# Patient Record
Sex: Female | Born: 1975 | Race: White | Hispanic: No | Marital: Married
Health system: Southern US, Community
[De-identification: ages and names within clinical notes are randomized; demographics above are authoritative.]

---

## 2019-08-29 ENCOUNTER — Other Ambulatory Visit: Payer: Self-pay

## 2019-08-29 ENCOUNTER — Emergency Department (HOSPITAL_BASED_OUTPATIENT_CLINIC_OR_DEPARTMENT_OTHER)
Admission: EM | Admit: 2019-08-29 | Discharge: 2019-08-29 | Disposition: A | Discharge status: Home / Self Care | Payer: Medicaid Other | Attending: Emergency Medicine | Admitting: Emergency Medicine

## 2019-08-29 ENCOUNTER — Encounter (HOSPITAL_BASED_OUTPATIENT_CLINIC_OR_DEPARTMENT_OTHER): Payer: Self-pay | Admitting: Emergency Medicine

## 2019-08-29 ENCOUNTER — Emergency Department (HOSPITAL_BASED_OUTPATIENT_CLINIC_OR_DEPARTMENT_OTHER): Payer: Medicaid Other

## 2019-08-29 DIAGNOSIS — K1120 Sialoadenitis, unspecified: Secondary | ICD-10-CM | POA: Insufficient documentation

## 2019-08-29 DIAGNOSIS — R600 Localized edema: Secondary | ICD-10-CM | POA: Diagnosis present

## 2019-08-29 LAB — BASIC METABOLIC PANEL
Anion gap: 9 (ref 5–15)
BUN: 9 mg/dL (ref 6–20)
CO2: 24 mmol/L (ref 22–32)
Calcium: 8.8 mg/dL — ABNORMAL LOW (ref 8.9–10.3)
Chloride: 104 mmol/L (ref 98–111)
Creatinine, Ser: 0.51 mg/dL (ref 0.44–1.00)
GFR calc Af Amer: >60 mL/min (ref >60)
GFR calc non Af Amer: >60 mL/min (ref >60)
Glucose, Bld: 127 mg/dL — ABNORMAL HIGH (ref 70–99)
Potassium: 3.8 mmol/L (ref 3.5–5.1)
Sodium: 137 mmol/L (ref 135–145)

## 2019-08-29 LAB — CBC WITH DIFFERENTIAL/PLATELET
Abs Immature Granulocytes: 0.06 10*3/uL (ref 0.00–0.07)
Basophils Absolute: 0 10*3/uL (ref 0.0–0.1)
Basophils Relative: 0 %
Eosinophils Absolute: 0.1 10*3/uL (ref 0.0–0.5)
Eosinophils Relative: 1 %
HCT: 38.6 % (ref 36.0–46.0)
Hemoglobin: 12.8 g/dL (ref 12.0–15.0)
Immature Granulocytes: 1 %
Lymphocytes Relative: 14 %
Lymphs Abs: 1.8 10*3/uL (ref 0.7–4.0)
MCH: 30 pg (ref 26.0–34.0)
MCHC: 33.2 g/dL (ref 30.0–36.0)
MCV: 90.6 fL (ref 80.0–100.0)
Monocytes Absolute: 0.8 10*3/uL (ref 0.1–1.0)
Monocytes Relative: 6 %
Neutro Abs: 9.9 10*3/uL — ABNORMAL HIGH (ref 1.7–7.7)
Neutrophils Relative %: 78 %
Platelets: 221 10*3/uL (ref 150–400)
RBC: 4.26 MIL/uL (ref 3.87–5.11)
RDW: 13.2 % (ref 11.5–15.5)
WBC: 12.7 10*3/uL — ABNORMAL HIGH (ref 4.0–10.5)
nRBC: 0 % (ref 0.0–0.2)

## 2019-08-29 MED ORDER — DEXAMETHASONE SODIUM PHOSPHATE 10 MG/ML IJ SOLN
10.0000 mg | Freq: Once | INTRAMUSCULAR | Status: AC
  Administered 2019-08-29: 13:00:00 10 mg via INTRAVENOUS
  Filled 2019-08-29: qty 1

## 2019-08-29 MED ORDER — IBUPROFEN 600 MG PO TABS: 600.0000 mg | ORAL_TABLET | Freq: Four times a day (QID) | ORAL | 0 refills | Status: AC | PRN

## 2019-08-29 MED ORDER — IOHEXOL 300 MG/ML  SOLN
100.0000 mL | Freq: Once | INTRAMUSCULAR | Status: AC | PRN
  Administered 2019-08-29: 75 mL via INTRAVENOUS

## 2019-08-29 MED ORDER — AMOXICILLIN-POT CLAVULANATE 875-125 MG PO TABS: 1.0000 | ORAL_TABLET | Freq: Two times a day (BID) | ORAL | 0 refills | Status: AC

## 2019-08-29 NOTE — ED Triage Notes (Signed)
Pt reports left side lower jaw swelling x 4 days. Pt denies dental pain or injury, normal gait, no s/s of distress. Left side swelling apparent. Pt came from UC after treatment. 400mg  ibuprofen at 0630 today. Pt reports fever of 100.5 last night

## 2019-08-29 NOTE — ED Provider Notes (Signed)
[facility] EMERGENCY DEPARTMENT Provider Note   CSN: [id number] Arrival date & time: 08/29/19  1121     History Chief Complaint  Patient presents with  . Facial Swelling    Erica Beck is a 44 y.o. female presents for evaluation of acute onset, progressively worsening left-sided facial swelling for 3 days.  Reports that symptoms began Thursday evening.  Denies any dental pain, facial trauma, or known insect bites.  Pain is constant, throbbing.  Worsens with palpation and certain movements. Denies neck stiffness. Denies any associated pain with eating.  No difficulty swallowing or drooling.  Reports fevers up to 100.6 F last night, improved with ibuprofen.  She went to urgent care earlier and was given a prescription for Bactrim which she has not started yet but was told that she may need further evaluation in an emergency department.  Reports she is up-to-date on her immunizations.  The history is provided by the patient.       No past medical history on file.  There are no problems to display for this patient.   OB History   No obstetric history on file.     No family history on file.  Social History   Tobacco Use  . Smoking status: Not on file  Substance Use Topics  . Alcohol use: Never  . Drug use: Never    Home Medications Prior to Admission medications   Medication Sig Start Date End Date Taking? Authorizing Provider  amoxicillin-clavulanate (AUGMENTIN) 875-125 MG tablet Take 1 tablet by mouth every 12 (twelve) hours. 08/29/19   Kiano Terrien A, PA-C  ibuprofen (ADVIL) 600 MG tablet Take 1 tablet (600 mg total) by mouth every 6 (six) hours as needed. 08/29/19   Renita Papa, PA-C    Allergies    Patient has no allergy information on record.  Review of Systems   Review of Systems  Constitutional: Negative for chills and fever.  HENT: Positive for facial swelling. Negative for dental problem, trouble swallowing and voice change.     Musculoskeletal: Negative for neck stiffness.  All other systems reviewed and are negative.   Physical Exam Updated Vital Signs BP 112/61   Pulse (!) 102   Temp 99.1 F (37.3 C) (Oral)   Resp 20   Ht 5' (1.524 m)   Wt 117.9 kg   LMP 08/13/2019 (Approximate)   SpO2 98%   BMI 50.78 kg/m   Physical Exam Vitals and nursing note reviewed.  Constitutional:      General: She is not in acute distress.    Appearance: She is well-developed.  HENT:     Head: Normocephalic and atraumatic.     Mouth/Throat:     Comments: Dentition is stable.  No trismus.  No gingival hypertrophy or erythema.  No tenderness to percussion of the teeth.  Tolerating secretions without difficulty.  Posterior oropharynx with no tonsillar hypertrophy, exudates, or uvular deviation.  No tenderness or palpation of the sublingual or subglossal spaces.  There is some left-sided swelling along the angle of the mandible extending into the anterior neck, some tenderness to palpation.  There is no erythema but the area appears mildly indurated.  Nonmobile Eyes:     General:        Right eye: No discharge.        Left eye: No discharge.     Conjunctiva/sclera: Conjunctivae normal.  Neck:     Vascular: No JVD.     Trachea: No tracheal deviation.  Comments: No midline cervical spine tenderness, normal active range of motion at the neck Cardiovascular:     Rate and Rhythm: Normal rate.  Pulmonary:     Effort: Pulmonary effort is normal.  Abdominal:     General: There is no distension.  Musculoskeletal:     Cervical back: Neck supple. No rigidity.  Skin:    General: Skin is warm and dry.     Findings: No erythema.  Neurological:     Mental Status: She is alert.  Psychiatric:        Behavior: Behavior normal.     ED Results / Procedures / Treatments   Labs (all labs ordered are listed, but only abnormal results are displayed) Labs Reviewed  BASIC METABOLIC PANEL - Abnormal; Notable for the following  components:      Result Value   Glucose, Bld 127 (*)    Calcium 8.8 (*)    All other components within normal limits  CBC WITH DIFFERENTIAL/PLATELET - Abnormal; Notable for the following components:   WBC 12.7 (*)    Neutro Abs 9.9 (*)    All other components within normal limits    EKG None  Radiology CT Soft Tissue Neck W Contrast  Result Date: 08/29/2019 CLINICAL DATA:  Lump or swelling left submandibular space. Pain to touch. Tonsil/adenoid disorder. Facial and neck swelling. EXAM: CT NECK WITH CONTRAST TECHNIQUE: Multidetector CT imaging of the neck was performed using the standard protocol following the bolus administration of intravenous contrast. CONTRAST:  55mL OMNIPAQUE IOHEXOL 300 MG/ML  SOLN COMPARISON:  None. FINDINGS: Pharynx and larynx: Mild prominence of the palatine tonsils is noted bilaterally. Slight uniform prominence of the adenoid tissue is present as well. No discrete mass is present. There is some stranding the parapharyngeal fat on the left. Tongue base and vallecula are normal. Epiglottis is within normal limits. Hypopharynx is clear. Vocal cords are midline and symmetrical. Salivary glands: The left submandibular gland is slightly more dense and larger than the right. No discrete mass lesion is present. Duct is within normal limits. The parotid glands and ducts are normal bilaterally. Thyroid: Normal Lymph nodes: Enlarged left submandibular nodes measure up to 1.3 x 1.1 x 1.2 cm. This node is somewhat rounded. Other similar mildly enlarged rounded left submandibular and level 2 lymph nodes are present. Slightly smaller right level 2 lymph nodes are present. There is thickening of the platysma just lateral to the left submandibular gland. No discrete abscess is present. Diffuse subcutaneous and deep tissue stranding is present. Vascular: No significant vascular lesions are present. Limited intracranial: Within normal limits. Visualized orbits: The globes and orbits are  within normal limits. Mastoids and visualized paranasal sinuses: The paranasal sinuses and mastoid air cells are clear. Skeleton: Vertebral body heights and alignment are normal. There is some straightening of the normal cervical lordosis. No discrete dental or periodontal disease is evident. Upper chest: The lung apices are clear. IMPRESSION: 1. Asymmetric enlargement and surrounding stranding of the left submandibular gland suggesting acute sialadenitis. No abscess present. No obstructing lesion is present. 2. Enlarged left submandibular and level 2 lymph nodes are likely reactive. 3. Prominence of the palatine tonsils and adenoid tissue suggesting pharyngitis and tonsillitis. No abscess is present. 4. No discrete mass lesion to suggest neoplasm. Electronically Signed   By: Marin Roberts M.D.   On: 08/29/2019 14:12    Procedures Procedures (including critical care time)  Medications Ordered in ED Medications  dexamethasone (DECADRON) injection 10 mg (10 mg Intravenous  Given 08/29/19 1255)  iohexol (OMNIPAQUE) 300 MG/ML solution 100 mL (75 mLs Intravenous Contrast Given 08/29/19 1337)    ED Course  I have reviewed the triage vital signs and the nursing notes.  Pertinent labs & imaging results that were available during my care of the patient were reviewed by me and considered in my medical decision making (see chart for details).    MDM Rules/Calculators/A&P                      Patient presenting for evaluation of left sided facial swelling for 4 days.  She is afebrile in the ED but intermittently mildly tachycardic.  Tolerating secretions without difficulty.  No evidence of strep pharyngitis or Ludwig's angina.  No meningeal signs to suggest meningitis.  Lab work reviewed and interpreted by myself shows mild leukocytosis, no anemia, no metabolic derangements, no renal insufficiency.  Imaging today is consistent with acute sial adenitis, no drainable abscess.  She has some reactive  lymphadenopathy as well.  No evidence of peritonsillar abscess or retropharyngeal abscess.  On reevaluation patient is resting comfortably in no apparent distress.  She is tolerating sips of p.o. fluid without difficulty.  Airway remains patent.  She is nontoxic in appearance.  Will discharge with course of Augmentin due to low-grade fevers at home yesterday, discussed utility of NSAIDs, warm compresses, tart candies to promote salivary flow.  Recommend follow-up with ENT for reevaluation of symptoms.  Discussed strict ED return precautions. Patient verbalized understanding of and agreement with plan and is safe for discharge home at this time.    Final Clinical Impression(s) / ED Diagnoses Final diagnoses:  Sialadenitis    Rx / DC Orders ED Discharge Orders         Ordered    ibuprofen (ADVIL) 600 MG tablet  Every 6 hours PRN     08/29/19 1455    amoxicillin-clavulanate (AUGMENTIN) 875-125 MG tablet  Every 12 hours     08/29/19 1455    Ambulatory referral to ENT    Comments: Acute sialadenitis   08/29/19 1456           Jeanie Sewer, PA-C 08/29/19 1640    Jacalyn Lefevre, MD 08/31/19 1507

## 2019-08-29 NOTE — Discharge Instructions (Signed)
Please take all of your antibiotics until finished!   Do not take the other antibiotic that was prescribed by the urgent care.  Take your antibiotics with food.  Common side effects of antibiotics include nausea, vomiting, abdominal discomfort, and diarrhea. You may help offset some of this with probiotics which you can buy or get in yogurt. Do not eat  or take the probiotics until 2 hours after your antibiotic.    Some studies suggest that certain antibiotics can reduce the efficacy of certain oral contraceptive pills (birth control), so please use additional contraceptives (condoms or other barrier method) while you are taking the antibiotics and for an additional 5 to 7 days afterwards if you are a female on these medications.  Alternate 600 mg of ibuprofen and [phone] mg of Tylenol every 3 hours as needed for pain. Do not exceed 4000 mg of Tylenol daily.  Take ibuprofen with food to avoid upset stomach issues.  You can massage/milk the gland which can help relieve pain.  He can also apply warm compresses 20 minutes at a time 3-4 times daily.  Can be managed by sucking on hard/tart candies throughout the day.  Follow-up with the ENT for reevaluation of your symptoms.  Return to the emergency department if any concerning signs or symptoms develop such as high fevers, difficulty breathing or swallowing, or loss of consciousness

## 2021-05-20 IMAGING — CT CT NECK W/ CM
3 of 4 series · 13 of 33 positions shown, 16 images · IV contrast (Omnipaque)
Comparison: None.

CLINICAL DATA: Lump or swelling left submandibular space. Pain to
touch. Tonsil/adenoid disorder. Facial and neck swelling.

EXAM:
CT NECK WITH CONTRAST
TECHNIQUE: Multidetector CT imaging of the neck was performed using the
standard protocol following the bolus administration of intravenous
contrast.
CONTRAST:  75mL OMNIPAQUE IOHEXOL 300 MG/ML  SOLN

[Series 3: axial neck · axial · 0.64mm/px · z∈[+570,+736]mm · 5 of 125 slices shown, 7 images]
[im 21/125  soft-tissue]
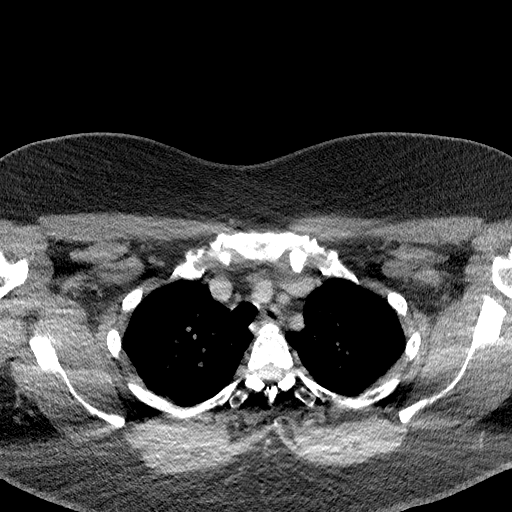
[im 21/125  bone]
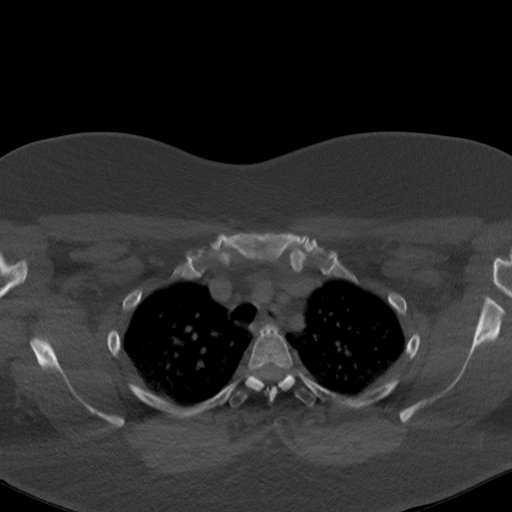
[im 42/125  bone]
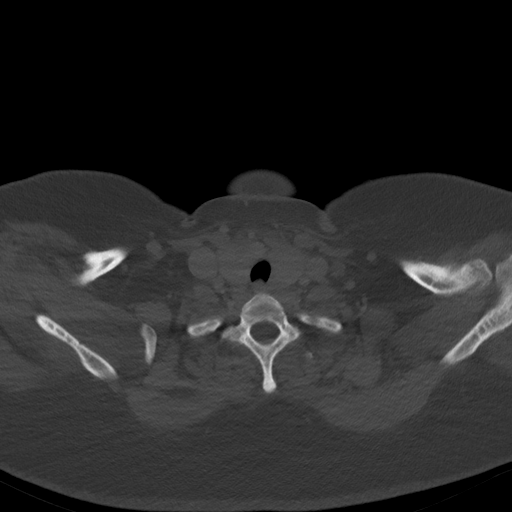
[im 63/125  bone]
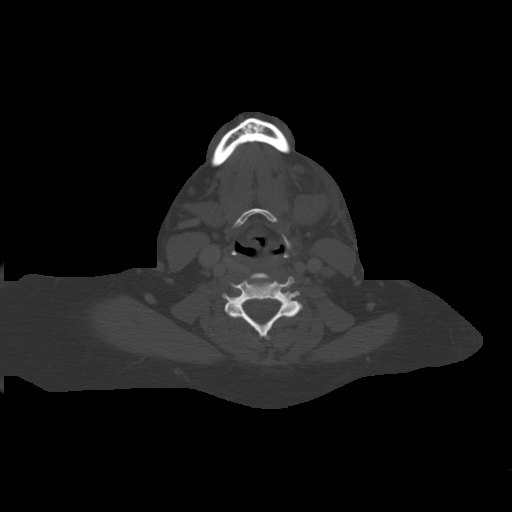
[im 83/125  bone]
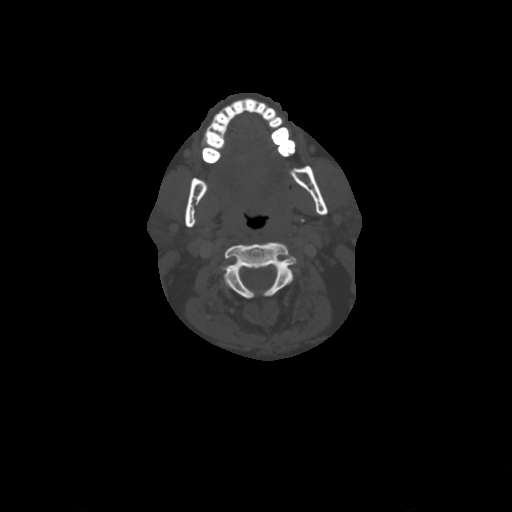
[im 104/125  soft-tissue]
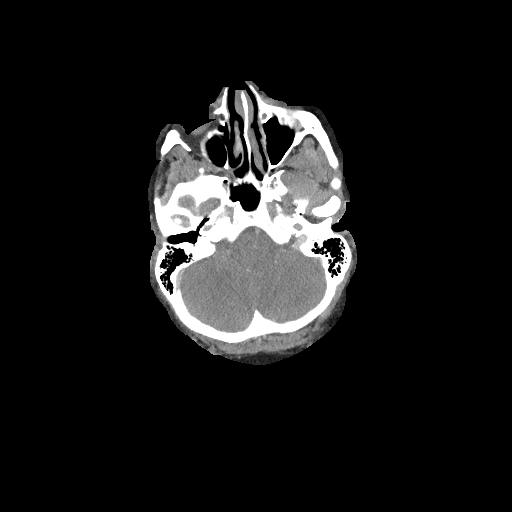
[im 104/125  bone]
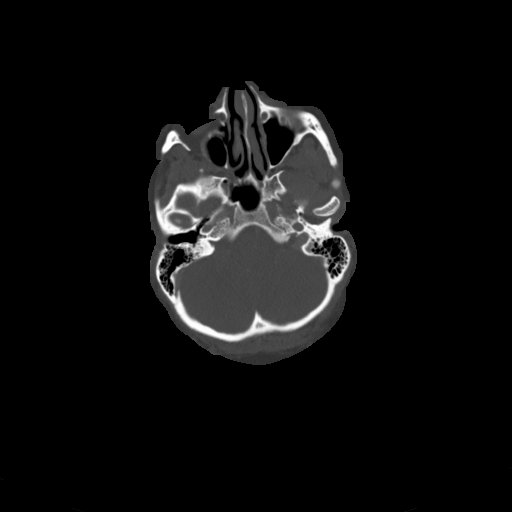

[Series 6: sag neck · sagittal · 0.48mm/px · 5 of 128 slices shown, 6 images]
[im 43/128  bone]
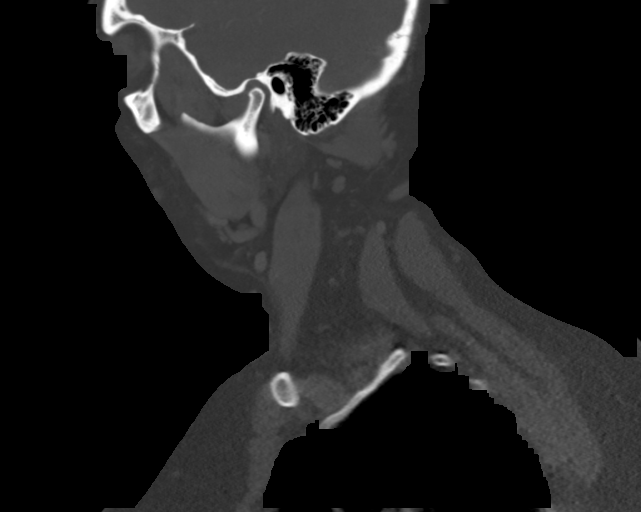
[im 53/128  bone]
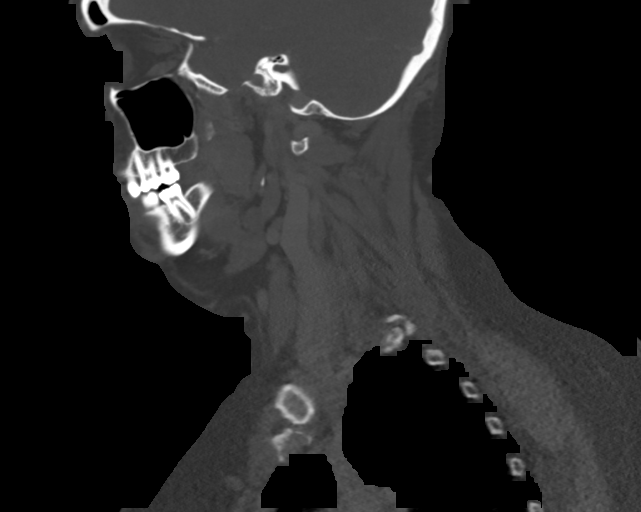
[im 64/128  soft-tissue]
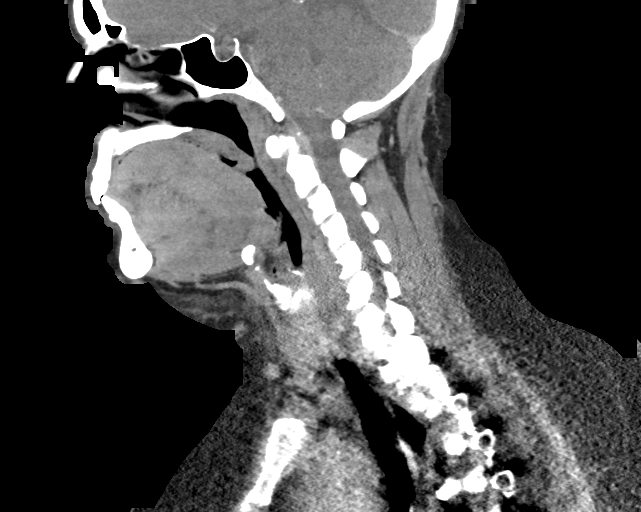
[im 64/128  bone]
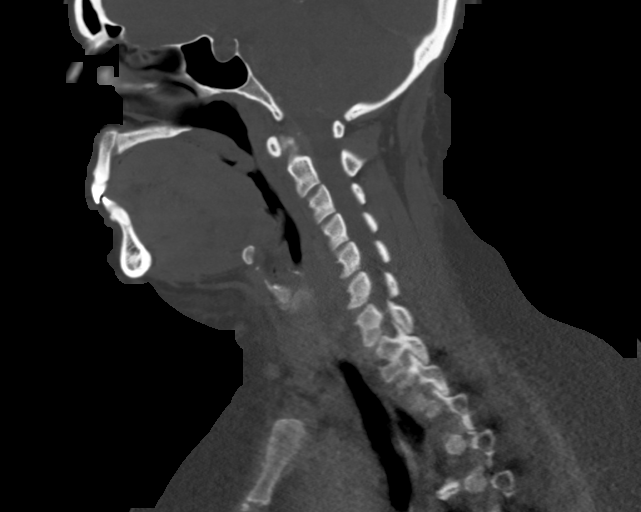
[im 75/128  bone]
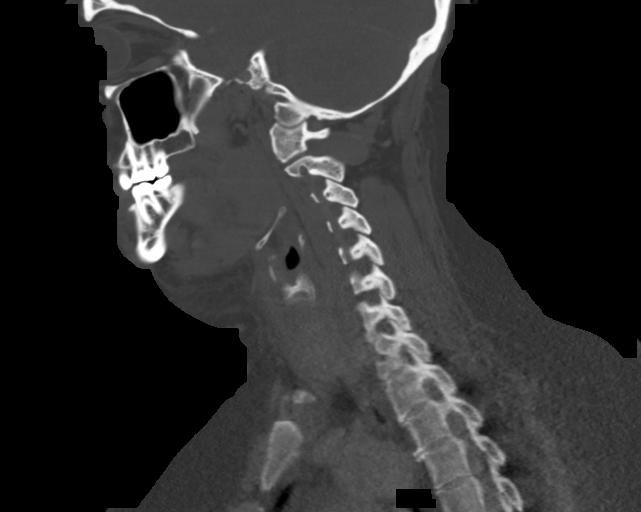
[im 85/128  bone]
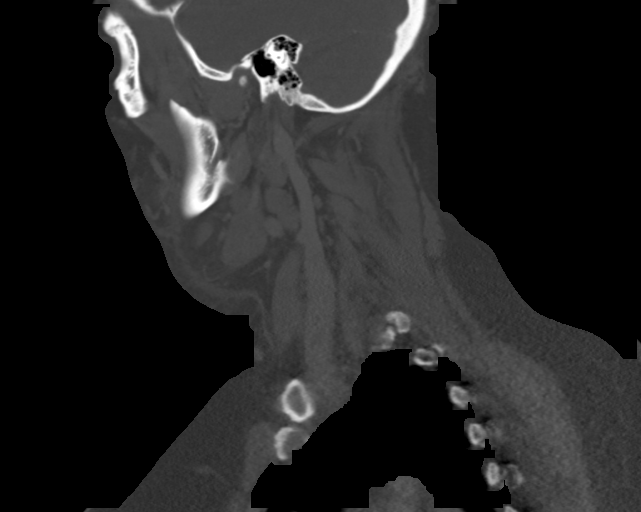

[Series 7: cor neck · coronal · 0.49mm/px · 3 of 111 slices shown]
[im 31/111  bone]
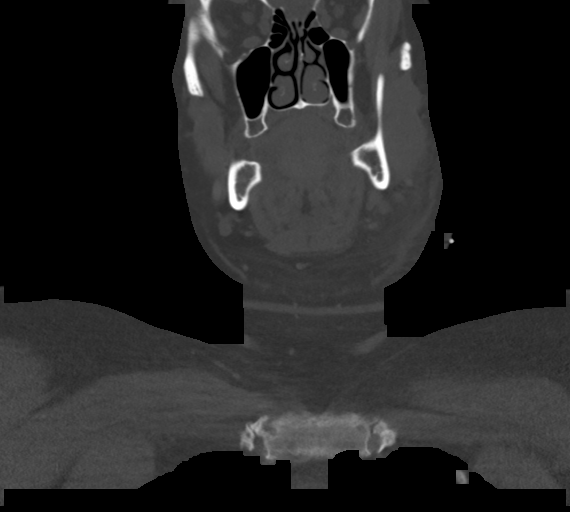
[im 47/111  bone]
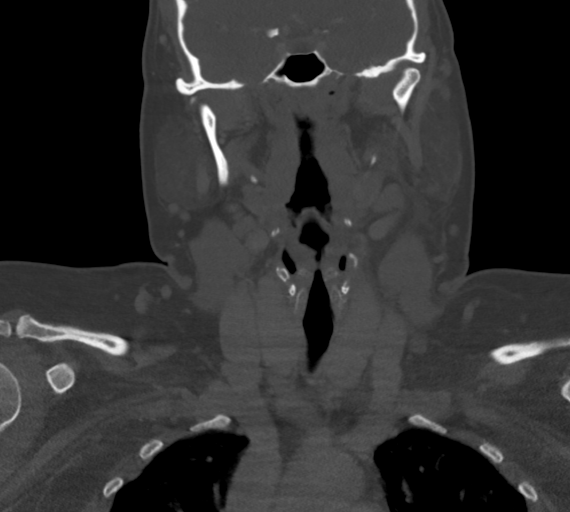
[im 64/111  bone]
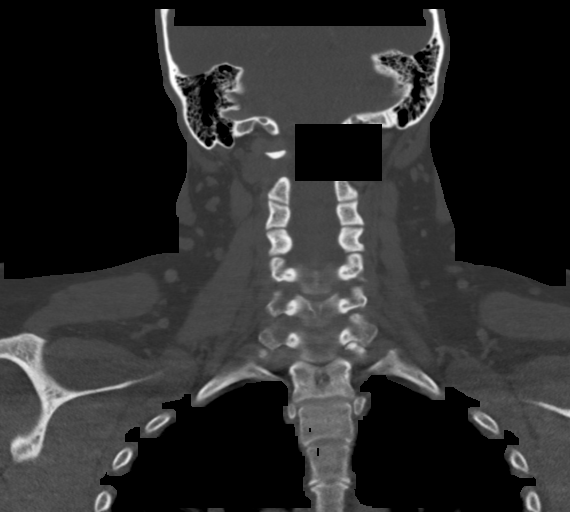

[13 of 33 positions shown; findings below may reference images not displayed]

FINDINGS: Pharynx and larynx: Mild prominence of the palatine tonsils is noted
bilaterally. Slight uniform prominence of the adenoid tissue is
present as well. No discrete mass is present. There is some
stranding the parapharyngeal fat on the left. Tongue base and
vallecula are normal. Epiglottis is within normal limits.
Hypopharynx is clear. Vocal cords are midline and symmetrical.

Salivary glands: The left submandibular gland is slightly more dense
and larger than the right. No discrete mass lesion is present. Duct
is within normal limits. The parotid glands and ducts are normal
bilaterally.

Thyroid: Normal

Lymph nodes: Enlarged left submandibular nodes measure up to 1.3 x
1.1 x 1.2 cm. This node is somewhat rounded. Other similar mildly
enlarged rounded left submandibular and level 2 lymph nodes are
present. Slightly smaller right level 2 lymph nodes are present.

There is thickening of the platysma just lateral to the left
submandibular gland. No discrete abscess is present. Diffuse
subcutaneous and deep tissue stranding is present.

Vascular: No significant vascular lesions are present.

Limited intracranial: Within normal limits.

Visualized orbits: The globes and orbits are within normal limits.

Mastoids and visualized paranasal sinuses: The paranasal sinuses and
mastoid air cells are clear.

Skeleton: Vertebral body heights and alignment are normal. There is
some straightening of the normal cervical lordosis. No discrete
dental or periodontal disease is evident.

Upper chest: The lung apices are clear.
IMPRESSION: 1. Asymmetric enlargement and surrounding stranding of the left
submandibular gland suggesting acute sialadenitis. No abscess
present. No obstructing lesion is present.
2. Enlarged left submandibular and level 2 lymph nodes are likely
reactive.
3. Prominence of the palatine tonsils and adenoid tissue suggesting
pharyngitis and tonsillitis. No abscess is present.
4. No discrete mass lesion to suggest neoplasm.
